# Patient Record
Sex: Male | Born: 1975 | Race: White | Hispanic: No | Marital: Single | Smoking: Former smoker
Health system: Southern US, Community
[De-identification: ages and names within clinical notes are randomized; demographics above are authoritative.]

## PROBLEM LIST (undated history)

## (undated) DIAGNOSIS — G709 Myoneural disorder, unspecified: Secondary | ICD-10-CM

## (undated) DIAGNOSIS — F419 Anxiety disorder, unspecified: Secondary | ICD-10-CM

## (undated) DIAGNOSIS — T7840XA Allergy, unspecified, initial encounter: Secondary | ICD-10-CM

## (undated) HISTORY — DX: Myoneural disorder, unspecified: G70.9

## (undated) HISTORY — DX: Allergy, unspecified, initial encounter: T78.40XA

## (undated) HISTORY — DX: Anxiety disorder, unspecified: F41.9

---

## 2006-08-24 ENCOUNTER — Ambulatory Visit: Payer: Self-pay | Admitting: Family Medicine

## 2012-06-30 ENCOUNTER — Ambulatory Visit: Payer: Self-pay | Admitting: Family Medicine

## 2013-01-03 ENCOUNTER — Ambulatory Visit: Payer: Self-pay | Admitting: Family Medicine

## 2013-03-22 ENCOUNTER — Ambulatory Visit: Payer: Self-pay | Admitting: Urology

## 2013-11-04 IMAGING — CT CT ABDOMEN AND PELVIS WITHOUT AND WITH CONTRAST
2 of 4 series · 14 of 32 positions shown, 19 images · non-contrast
Comparison: none

REASON FOR EXAM: prostatitis, anterior pelvic pain, rectal bleed, IV and
oral contrast WWO
COMMENTS:

PROCEDURE:     CT  - CT ABDOMEN / PELVIS  W/WO  - March 22, 2013  [DATE]
RESULT:     History: Prostatitis. Pelvic pain. Rectal bleed.
Comparison Study: No prior.

[Series 2: without · axial · non-contrast · 0.81mm/px · z∈[-519,-138]mm · 8 of 165 slices shown, 13 images]
[im 19/165  soft-tissue]
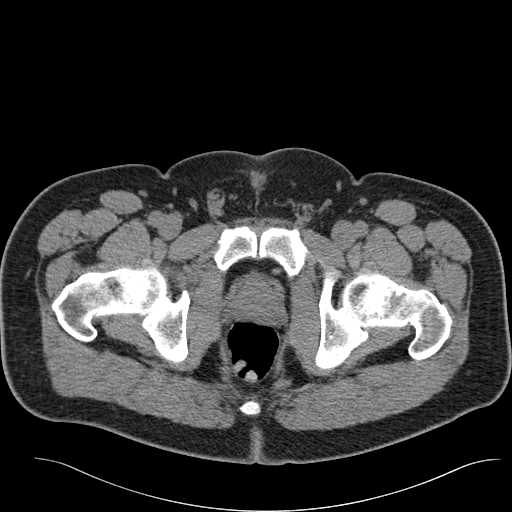
[im 19/165  bone]
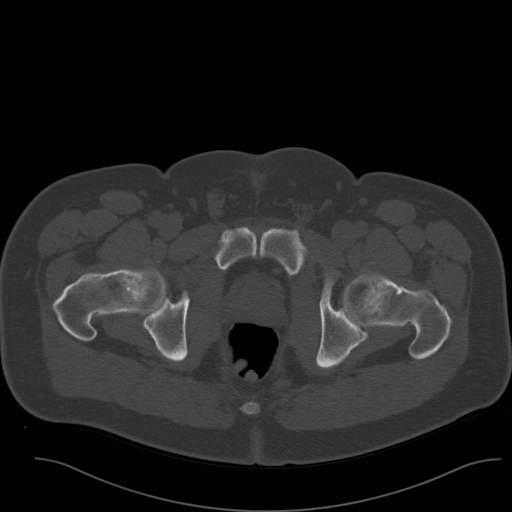
[im 37/165  soft-tissue]
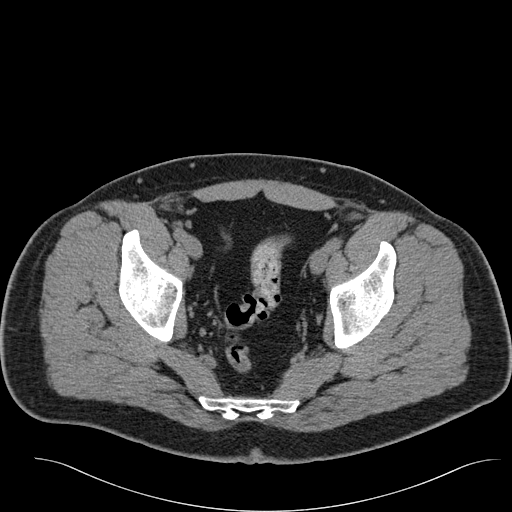
[im 55/165  soft-tissue]
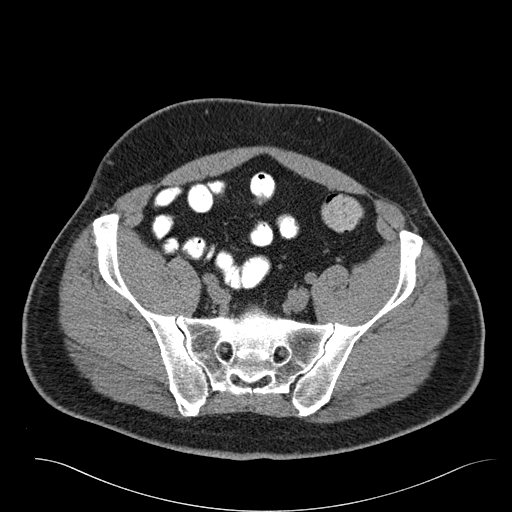
[im 73/165  soft-tissue]
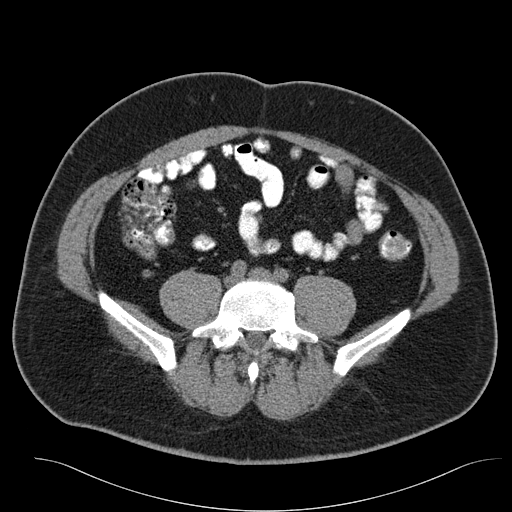
[im 92/165  soft-tissue]
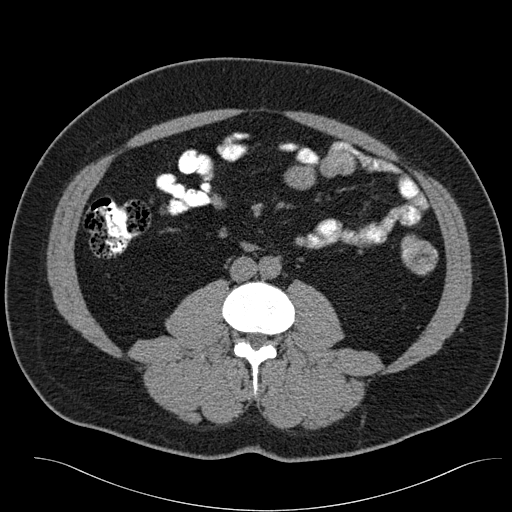
[im 92/165  lung]
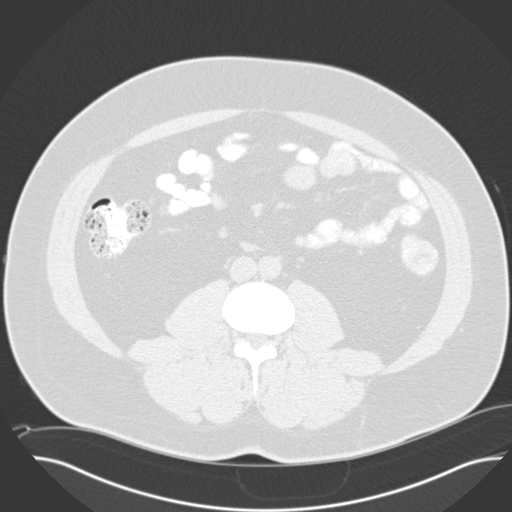
[im 110/165  soft-tissue]
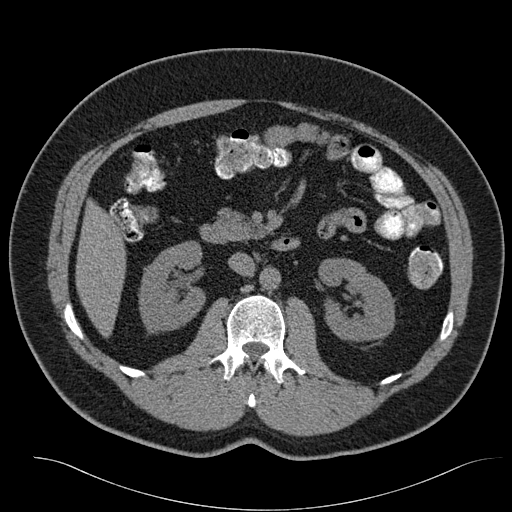
[im 110/165  lung]
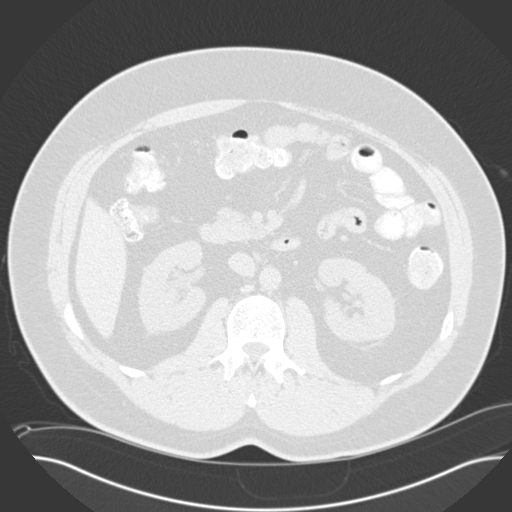
[im 128/165  soft-tissue]
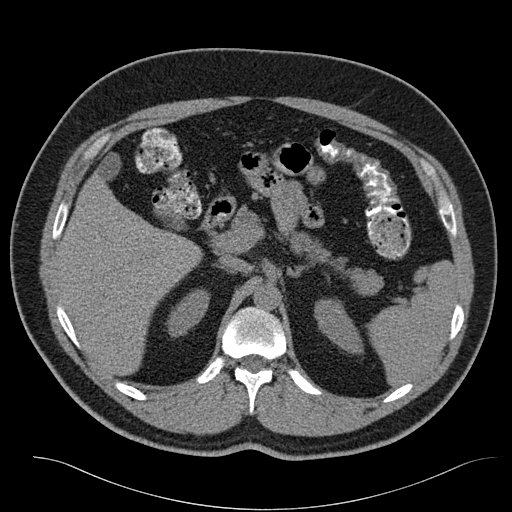
[im 128/165  lung]
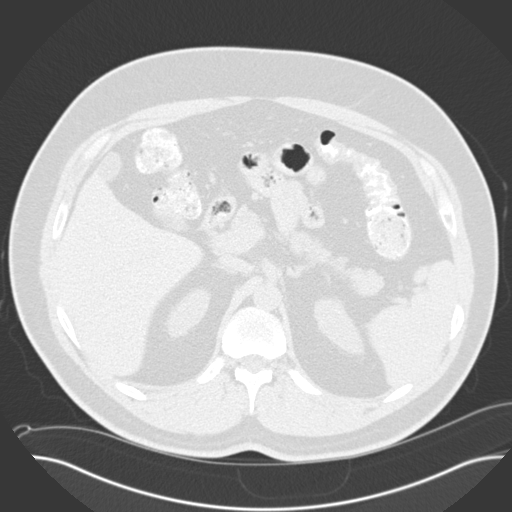
[im 146/165  soft-tissue]
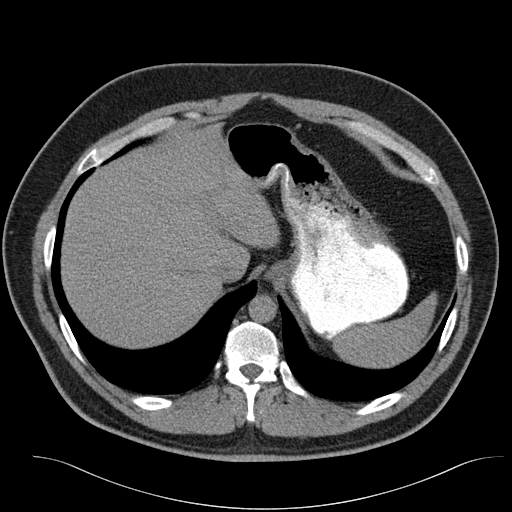
[im 146/165  lung]
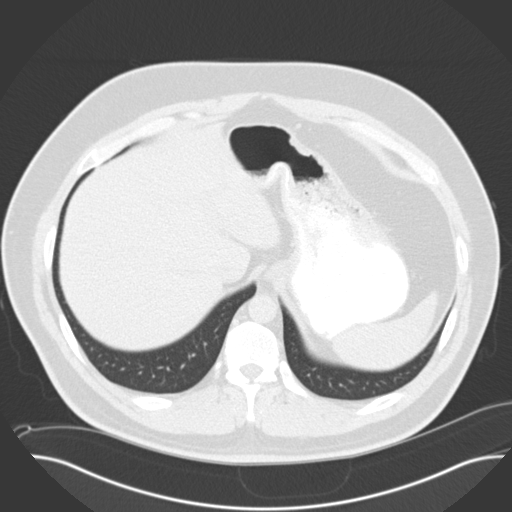

[Series 4: with · axial · 0.81mm/px · z∈[-519,-246]mm · 6 of 165 slices shown]
[im 19/165  soft-tissue]
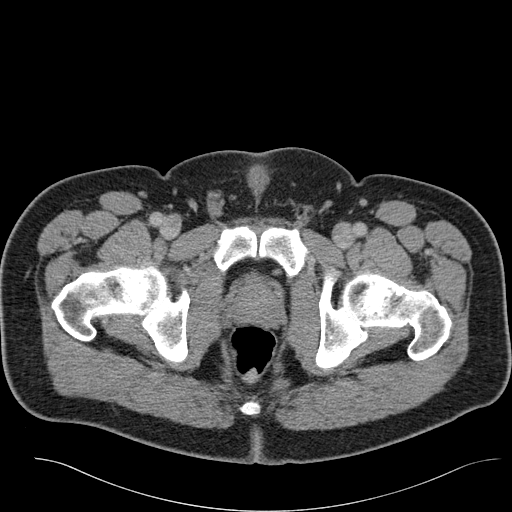
[im 37/165  soft-tissue]
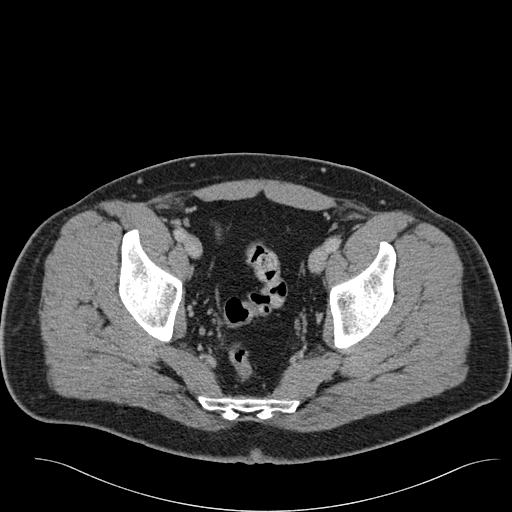
[im 55/165  soft-tissue]
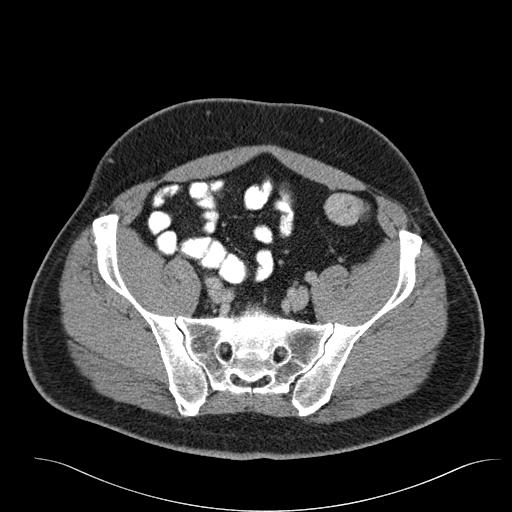
[im 73/165  soft-tissue]
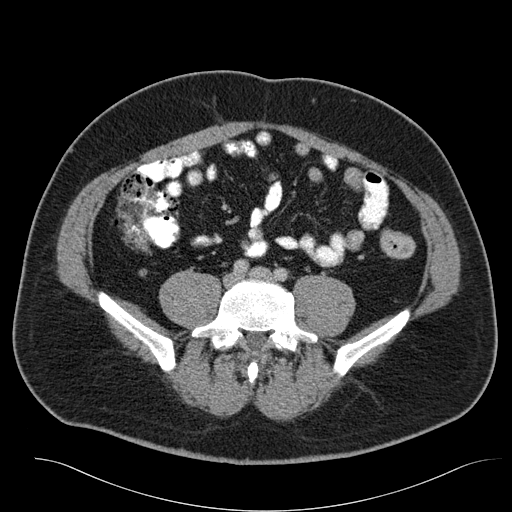
[im 92/165  soft-tissue]
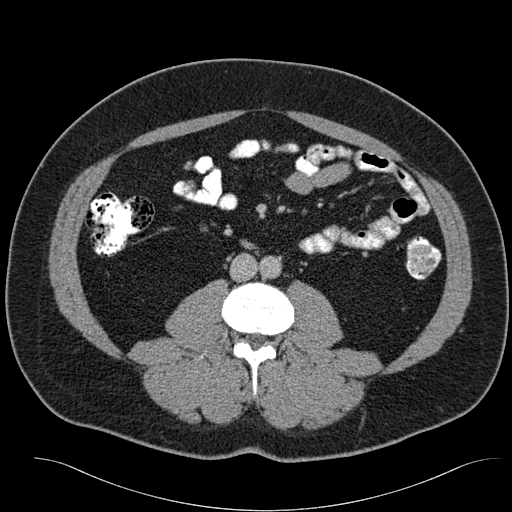
[im 110/165  soft-tissue]
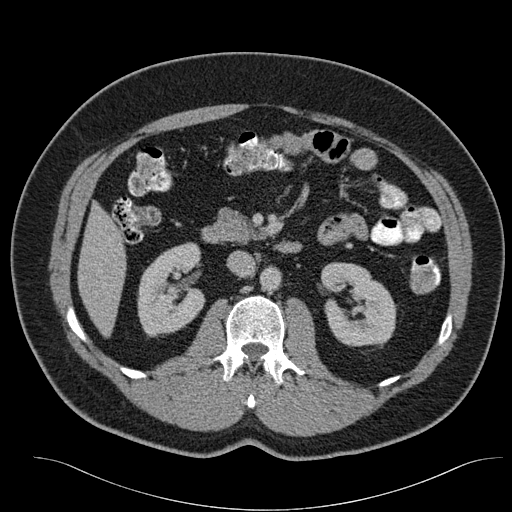

[14 of 32 positions shown; findings below may reference images not displayed]

FINDINGS: Triphasic CT performed with 100 cc of Fsovue-WPC. Evaluation 3
dimensions on separate workstation performed. Nonenhanced exam reveals no
evidence of urolithiasis. Kidneys are normal. Bladder is nondistended and
without focal abnormality. Prostate is irregular and enlarged. Prostatitis
and/or prostate cancer could present in this fashion. Periprosthetic
nodularity and/or nodes cannot be excluded. No retroperitoneal adenopathy
noted. Small shotty inguinal lymph nodes are noted.

The liver normal. Hepatic and portal veins are patent. Gallbladder is
nondistended. No biliary distention; pancreas is normal. Spleen is normal.
Splenic vein is patent. Adrenals are normal. Gastric and perigastric regions
normal. Stool noted throughout colon. No bowel distention. Appendix is
normal. No inflammatory change in right or left lower quadrant. Aorta and
visceral vessels patent. Lung bases are clear. No acute or focal bony
abnormality.
IMPRESSION: Nodular prostate enlargement with periprosthetic
nodularity. Prostatitis and/or prostate malignancy with periprosthetic small
lymph nodes cannot be excluded.

## 2014-12-13 DIAGNOSIS — R369 Urethral discharge, unspecified: Secondary | ICD-10-CM | POA: Insufficient documentation

## 2015-06-19 DIAGNOSIS — R030 Elevated blood-pressure reading, without diagnosis of hypertension: Secondary | ICD-10-CM | POA: Insufficient documentation

## 2019-10-06 ENCOUNTER — Encounter: Payer: Self-pay | Admitting: Emergency Medicine

## 2019-10-06 ENCOUNTER — Other Ambulatory Visit: Payer: Self-pay

## 2019-10-06 ENCOUNTER — Ambulatory Visit
Admission: EM | Admit: 2019-10-06 | Discharge: 2019-10-06 | Disposition: A | Discharge status: Home / Self Care | Payer: Self-pay

## 2019-10-06 DIAGNOSIS — H6123 Impacted cerumen, bilateral: Secondary | ICD-10-CM

## 2019-10-06 NOTE — ED Provider Notes (Signed)
Roderic Palau    CSN: [id number] Arrival date & time: 10/06/19  1157      History   Chief Complaint Chief Complaint  Patient presents with  . Cerumen Impaction    HPI Johnny Shaw is a 43 y.o. male.   Patient presents with bilateral ear "clogged up" x1 day.  He states he was able to remove some earwax from his left ear with his finger and then used an over-the-counter cerumen kit as well yesterday.  He denies decreased hearing, fever, chills, ear pain, sore throat, cough, shortness of breath, or other symptoms.  The history is provided by the patient.    History reviewed. No pertinent past medical history.  There are no problems to display for this patient.   History reviewed. No pertinent surgical history.     Home Medications    Prior to Admission medications   Not on File    Family History History reviewed. No pertinent family history.  Social History Social History   Tobacco Use  . Smoking status: Never Smoker  . Smokeless tobacco: Never Used  Substance Use Topics  . Alcohol use: Not on file  . Drug use: Not on file     Allergies   Penicillins and Ciprofloxacin hcl   Review of Systems Review of Systems  Constitutional: Negative for chills and fever.  HENT: Negative for ear pain, hearing loss and sore throat.   Eyes: Negative for pain and visual disturbance.  Respiratory: Negative for cough and shortness of breath.   Cardiovascular: Negative for chest pain and palpitations.  Gastrointestinal: Negative for abdominal pain and vomiting.  Genitourinary: Negative for dysuria and hematuria.  Musculoskeletal: Negative for arthralgias and back pain.  Skin: Negative for color change and rash.  Neurological: Negative for seizures and syncope.  All other systems reviewed and are negative.    Physical Exam Triage Vital Signs ED Triage Vitals  Enc Vitals Group     BP      Pulse      Resp      Temp      Temp src      SpO2      Weight        Height      Head Circumference      Peak Flow      Pain Score      Pain Loc      Pain Edu?      Excl. in [place]?    No data found.  Updated Vital Signs BP 131/86   Pulse 82   Temp 98.9 F (37.2 C)   Resp 18   Wt 280 lb (127 kg)   SpO2 97%   Visual Acuity Right Eye Distance:   Left Eye Distance:   Bilateral Distance:    Right Eye Near:   Left Eye Near:    Bilateral Near:     Physical Exam Vitals and nursing note reviewed.  Constitutional:      Appearance: He is well-developed.  HENT:     Head: Normocephalic and atraumatic.     Right Ear: Tympanic membrane normal. There is impacted cerumen.     Left Ear: Tympanic membrane normal. There is impacted cerumen.     Nose: Nose normal.     Mouth/Throat:     Mouth: Mucous membranes are moist.     Pharynx: Oropharynx is clear.  Eyes:     Conjunctiva/sclera: Conjunctivae normal.  Cardiovascular:     Rate and Rhythm: Normal  rate and regular rhythm.     Heart sounds: No murmur.  Pulmonary:     Effort: Pulmonary effort is normal. No respiratory distress.     Breath sounds: Normal breath sounds. No wheezing or rhonchi.  Abdominal:     General: Bowel sounds are normal.     Palpations: Abdomen is soft.     Tenderness: There is no abdominal tenderness. There is no guarding or rebound.  Musculoskeletal:     Cervical back: Neck supple.  Skin:    General: Skin is warm and dry.     Findings: No rash.  Neurological:     General: No focal deficit present.     Mental Status: He is alert and oriented to person, place, and time.  Psychiatric:        Mood and Affect: Mood normal.        Behavior: Behavior normal.      UC Treatments / Results  Labs (all labs ordered are listed, but only abnormal results are displayed) Labs Reviewed - No data to display  EKG   Radiology No results found.  Procedures Procedures (including critical care time)  Medications Ordered in UC Medications - No data to display  Initial  Impression / Assessment and Plan / UC Course  I have reviewed the triage vital signs and the nursing notes.  Pertinent labs & imaging results that were available during my care of the patient were reviewed by me and considered in my medical decision making (see chart for details).   Bilateral impacted cerumen.  Removed with irrigation by nurse.  Patient reports complete relief of symptoms after cerumen removed.  Instructed patient to follow-up with his primary care provider if his symptoms return.  Patient agrees to plan of care.     Final Clinical Impressions(s) / UC Diagnoses   Final diagnoses:  Bilateral impacted cerumen     Discharge Instructions     Your earwax was removed today via irrigation.    Follow up with your primary care provider if your symptoms are not improving.        ED Prescriptions    None     PDMP not reviewed this encounter.   Sharion Balloon, NP 10/06/19 [phone]

## 2019-10-06 NOTE — Discharge Instructions (Addendum)
Your earwax was removed today via irrigation.    Follow up with your primary care provider if your symptoms are not improving.

## 2019-10-06 NOTE — ED Triage Notes (Signed)
Patient in office today c/o bil ear clog since yesterday. Brought OTC drops to remove wax got some out but still feels clog left ear  more than right.

## 2023-02-06 ENCOUNTER — Ambulatory Visit
Admission: RE | Admit: 2023-02-06 | Discharge: 2023-02-06 | Disposition: A | Discharge status: Home / Self Care | Payer: Managed Care, Other (non HMO) | Source: Ambulatory Visit | Attending: Urgent Care | Admitting: Urgent Care

## 2023-02-06 VITALS — BP 147/85 | HR 72 | Temp 97.8°F | Resp 16

## 2023-02-06 DIAGNOSIS — J019 Acute sinusitis, unspecified: Secondary | ICD-10-CM | POA: Diagnosis not present

## 2023-02-06 MED ORDER — PREDNISONE 20 MG PO TABS: ORAL_TABLET | ORAL | 0 refills | Status: AC

## 2023-02-06 NOTE — ED Triage Notes (Signed)
Patient presents to UC for cough since Sunday, sore throat and ear fullness since Monday. Treating with claritin and ibuprofen.

## 2023-02-06 NOTE — Discharge Instructions (Addendum)
Follow up here or with your primary care provider if your symptoms are worsening or not improving.     

## 2023-02-06 NOTE — ED Provider Notes (Signed)
Johnny Shaw    CSN: [id number] Arrival date & time: 02/06/23  1254      History   Chief Complaint Chief Complaint  Patient presents with   Sore Throat    Sore throat since Monday, ear clogged from flying, cough with thick phlegm . Cough started first on Sunday after fishing in high winds in [place], cough was dry sporadic but deep (shook lungs feeling) - Entered by patient   Ear Fullness   Cough    HPI Johnny Shaw is a 47 y.o. male.    Sore Throat  Ear Fullness  Cough   Presents to urgent care with cough productive of thick sputum x 5 days.  Reports sore throat "ear fullness" x 4 days.  He reports his ears clogged from "flying".  History reviewed. No pertinent past medical history.  Patient Active Problem List   Diagnosis Date Noted   Elevated BP without diagnosis of hypertension 06/19/2015   Urethral discharge 12/13/2014    History reviewed. No pertinent surgical history.     Home Medications    Prior to Admission medications   Not on File    Family History History reviewed. No pertinent family history.  Social History Social History   Tobacco Use   Smoking status: Never   Smokeless tobacco: Never  Vaping Use   Vaping Use: Never used  Substance Use Topics   Alcohol use: Yes    Comment: social drinker   Drug use: Never     Allergies   Penicillins and Ciprofloxacin hcl   Review of Systems Review of Systems  Respiratory:  Positive for cough.      Physical Exam Triage Vital Signs ED Triage Vitals  Enc Vitals Group     BP 02/06/23 1313 (!) 147/85     Pulse Rate 02/06/23 1313 72     Resp 02/06/23 1313 16     Temp 02/06/23 1313 97.8 F (36.6 C)     Temp Source 02/06/23 1313 Temporal     SpO2 02/06/23 1313 97 %     Weight --      Height --      Head Circumference --      Peak Flow --      Pain Score 02/06/23 1312 0     Pain Loc --      Pain Edu? --      Excl. in [place]? --    No data found.  Updated Vital  Signs BP (!) 147/85 (BP Location: Left Arm)   Pulse 72   Temp 97.8 F (36.6 C) (Temporal)   Resp 16   SpO2 97%   Visual Acuity Right Eye Distance:   Left Eye Distance:   Bilateral Distance:    Right Eye Near:   Left Eye Near:    Bilateral Near:     Physical Exam Vitals reviewed.  Constitutional:      Appearance: He is well-developed.  HENT:     Right Ear: Tympanic membrane is erythematous.     Left Ear: Tympanic membrane normal.     Ears:      Mouth/Throat:     Mouth: Mucous membranes are moist.     Pharynx: Posterior oropharyngeal erythema present. No oropharyngeal exudate.  Cardiovascular:     Rate and Rhythm: Normal rate and regular rhythm.  Pulmonary:     Effort: Pulmonary effort is normal.     Breath sounds: Normal breath sounds.  Skin:    General: Skin is warm  and dry.  Neurological:     General: No focal deficit present.     Mental Status: He is alert and oriented to person, place, and time.  Psychiatric:        Mood and Affect: Mood normal.        Behavior: Behavior normal.      UC Treatments / Results  Labs (all labs ordered are listed, but only abnormal results are displayed) Labs Reviewed - No data to display  EKG   Radiology No results found.  Procedures Procedures (including critical care time)  Medications Ordered in UC Medications - No data to display  Initial Impression / Assessment and Plan / UC Course  I have reviewed the triage vital signs and the nursing notes.  Pertinent labs & imaging results that were available during my care of the patient were reviewed by me and considered in my medical decision making (see chart for details).   Johnny Shaw is a 47 y.o. male presenting with URI symptoms. Patient is afebrile without recent antipyretics, satting well on room air. Overall is well appearing though non-toxic, well hydrated, without respiratory distress. Pulmonary exam is unremarkable.  Lungs CTAB without wheezing, rhonchi,  rales.  A rim of redness is present around the right TM, there is pharyngeal erythema.  No peritonsillar exudates.  Patient's symptoms are consistent with an acute viral process.  We discussed antibiotic stewardship, recommended use of prednisone to reduce the symptoms of sinusitis resulting in eustachian tube dysfunction.  This is likely to help with his sore throat and cough as well.  Otherwise recommending supportive care and use of OTC medication for symptom control as needed.  Counseled patient on potential for adverse effects with medications prescribed/recommended today, ER and return-to-clinic precautions discussed, patient verbalized understanding and agreement with care plan.   Final Clinical Impressions(s) / UC Diagnoses   Final diagnoses:  None   Discharge Instructions   None    ED Prescriptions   None    PDMP not reviewed this encounter.   Charma Igo, [place] 02/06/23 1328

## 2023-02-09 ENCOUNTER — Ambulatory Visit
Admission: EM | Admit: 2023-02-09 | Discharge: 2023-02-09 | Disposition: A | Discharge status: Home / Self Care | Payer: Managed Care, Other (non HMO) | Attending: Emergency Medicine | Admitting: Emergency Medicine

## 2023-02-09 DIAGNOSIS — H1033 Unspecified acute conjunctivitis, bilateral: Secondary | ICD-10-CM

## 2023-02-09 DIAGNOSIS — H6691 Otitis media, unspecified, right ear: Secondary | ICD-10-CM

## 2023-02-09 DIAGNOSIS — J0100 Acute maxillary sinusitis, unspecified: Secondary | ICD-10-CM

## 2023-02-09 MED ORDER — POLYMYXIN B-TRIMETHOPRIM 10000-0.1 UNIT/ML-% OP SOLN: 1.0000 [drp] | Freq: Four times a day (QID) | OPHTHALMIC | 0 refills | Status: AC

## 2023-02-09 MED ORDER — AZITHROMYCIN 250 MG PO TABS: 250.0000 mg | ORAL_TABLET | Freq: Every day | ORAL | 0 refills | Status: DC

## 2023-02-09 NOTE — ED Triage Notes (Signed)
Patient to Urgent Care with complaints of right sided eye redness and drainage. Woke up this morning with eye crusted shut. Some redness present to left eye. Also complains of right ear pain and fullness.  Symptoms started 8 days ago. Initially started with sore throat/ cough/ sinus pain after fishing in [place]. Treated Friday with prednisone taper (two days remaining). Denies sore throat at this time.   Taking claritin-D/ mucinex/ tylenol cold and flu.

## 2023-02-09 NOTE — Discharge Instructions (Addendum)
Take the Zithromax and use the antibiotic eye drops as directed.  Follow up with your primary care provider if your symptoms are not improving.    

## 2023-02-09 NOTE — ED Provider Notes (Signed)
Johnny Shaw    CSN: [id number] Arrival date & time: 02/09/23  0816      History   Chief Complaint Chief Complaint  Patient presents with   Eye Problem   Otalgia    HPI Johnny Shaw is a 47 y.o. male.  Patient present with 8 day history of congestion, postnasal drip, ear pain, cough.  He also reports right eye redness, itching, yellow-green drainage, crusting in lashes x 2 days.  No eye pain, change in vision, fever, rash, shortness of breath, or other symptoms.  Patient was seen at this urgent care on 02/06/2023; diagnosed with sinusitis and treated with prednisone.   The history is provided by the patient and medical records.    History reviewed. No pertinent past medical history.  Patient Active Problem List   Diagnosis Date Noted   Elevated BP without diagnosis of hypertension 06/19/2015   Urethral discharge 12/13/2014    History reviewed. No pertinent surgical history.     Home Medications    Prior to Admission medications   Medication Sig Start Date End Date Taking? Authorizing Provider  azithromycin (ZITHROMAX) 250 MG tablet Take 1 tablet (250 mg total) by mouth daily. Take first 2 tablets together, then 1 every day until finished. 02/09/23  Yes Mickie Bail, NP  trimethoprim-polymyxin b (POLYTRIM) ophthalmic solution Place 1 drop into both eyes 4 (four) times daily for 7 days. 02/09/23 02/16/23 Yes Mickie Bail, NP  predniSONE (DELTASONE) 20 MG tablet Take 3 tablets (60 mg total) by mouth daily with breakfast for 2 days, THEN 2 tablets (40 mg total) daily with breakfast for 2 days, THEN 1 tablet (20 mg total) daily with breakfast for 2 days. 02/06/23 02/11/23  Immordino, Jeannett Senior, FNP    Family History History reviewed. No pertinent family history.  Social History Social History   Tobacco Use   Smoking status: Never   Smokeless tobacco: Never  Vaping Use   Vaping Use: Never used  Substance Use Topics   Alcohol use: Yes    Comment: social drinker    Drug use: Never     Allergies   Penicillins and Ciprofloxacin hcl   Review of Systems Review of Systems  Constitutional:  Negative for chills and fever.  HENT:  Positive for congestion, ear pain, postnasal drip, rhinorrhea and sinus pressure. Negative for sore throat.   Eyes:  Positive for discharge, redness and itching. Negative for pain and visual disturbance.  Respiratory:  Positive for cough. Negative for shortness of breath.   Cardiovascular:  Negative for chest pain and palpitations.  Gastrointestinal:  Negative for abdominal pain, diarrhea and vomiting.  Skin:  Negative for rash.  All other systems reviewed and are negative.    Physical Exam Triage Vital Signs ED Triage Vitals  Enc Vitals Group     BP      Pulse      Resp      Temp      Temp src      SpO2      Weight      Height      Head Circumference      Peak Flow      Pain Score      Pain Loc      Pain Edu?      Excl. in [place]?    No data found.  Updated Vital Signs BP 134/84   Pulse 90   Temp 98.4 F (36.9 C)   Resp 18  SpO2 98%   Visual Acuity Right Eye Distance:   Left Eye Distance:   Bilateral Distance:    Right Eye Near:   Left Eye Near:    Bilateral Near:     Physical Exam Vitals and nursing note reviewed.  Constitutional:      General: He is not in acute distress.    Appearance: Normal appearance. He is well-developed. He is not ill-appearing.  HENT:     Right Ear: Ear canal normal. Tympanic membrane is erythematous.     Left Ear: Tympanic membrane and ear canal normal.     Nose: Congestion and rhinorrhea present.     Mouth/Throat:     Mouth: Mucous membranes are moist.     Pharynx: Oropharynx is clear.  Eyes:     General: Lids are normal. Vision grossly intact.        Right eye: No discharge.        Left eye: No discharge.     Extraocular Movements: Extraocular movements intact.     Conjunctiva/sclera:     Right eye: Right conjunctiva is injected.     Left eye: Left  conjunctiva is injected.     Pupils: Pupils are equal, round, and reactive to light.  Cardiovascular:     Rate and Rhythm: Normal rate and regular rhythm.     Heart sounds: Normal heart sounds.  Pulmonary:     Effort: Pulmonary effort is normal. No respiratory distress.     Breath sounds: Normal breath sounds.  Musculoskeletal:     Cervical back: Neck supple.  Skin:    General: Skin is warm and dry.  Neurological:     Mental Status: He is alert.  Psychiatric:        Mood and Affect: Mood normal.        Behavior: Behavior normal.      UC Treatments / Results  Labs (all labs ordered are listed, but only abnormal results are displayed) Labs Reviewed - No data to display  EKG   Radiology No results found.  Procedures Procedures (including critical care time)  Medications Ordered in UC Medications - No data to display  Initial Impression / Assessment and Plan / UC Course  I have reviewed the triage vital signs and the nursing notes.  Pertinent labs & imaging results that were available during my care of the patient were reviewed by me and considered in my medical decision making (see chart for details).   Right otitis media, acute sinusitis, bilateral bacterial conjunctivitis.  Afebrile and vital signs are stable.  Patient has been symptomatic for more than a week.  He was treated with prednisone on 02/06/2023 but his symptoms are worsening.  Treating today with Zithromax and Polytrim eyedrops.  Instructed him to finish the prednisone as prescribed.  Tylenol or ibuprofen as needed.  Education provided on otitis media and conjunctivitis.  Patient does not currently have a PCP but our nurse scheduled him with an appointment to establish.  He agrees to plan of care.   Final Clinical Impressions(s) / UC Diagnoses   Final diagnoses:  Right otitis media, unspecified otitis media type  Acute non-recurrent maxillary sinusitis  Acute bacterial conjunctivitis of both eyes      Discharge Instructions      Take the Zithromax and use the antibiotic eye drops as directed.  Follow up with your primary care provider if your symptoms are not improving.        ED Prescriptions  Medication Sig Dispense Auth. Provider   azithromycin (ZITHROMAX) 250 MG tablet Take 1 tablet (250 mg total) by mouth daily. Take first 2 tablets together, then 1 every day until finished. 6 tablet Mickie Bail, NP   trimethoprim-polymyxin b (POLYTRIM) ophthalmic solution Place 1 drop into both eyes 4 (four) times daily for 7 days. 10 mL Mickie Bail, NP      PDMP not reviewed this encounter.   Mickie Bail, NP 02/09/23 [phone]

## 2023-02-09 NOTE — ED Notes (Signed)
Patient established with PCP. Appointment made for June 10th at 11am @ [facility] at [employer].

## 2023-03-23 ENCOUNTER — Encounter: Payer: Self-pay | Admitting: Nurse Practitioner

## 2023-03-23 ENCOUNTER — Ambulatory Visit: Payer: Managed Care, Other (non HMO) | Admitting: Nurse Practitioner

## 2023-03-23 VITALS — BP 120/84 | HR 62 | Temp 98.0°F | Ht 72.44 in | Wt 248.0 lb

## 2023-03-23 DIAGNOSIS — R051 Acute cough: Secondary | ICD-10-CM | POA: Diagnosis not present

## 2023-03-23 DIAGNOSIS — F419 Anxiety disorder, unspecified: Secondary | ICD-10-CM | POA: Insufficient documentation

## 2023-03-23 DIAGNOSIS — J309 Allergic rhinitis, unspecified: Secondary | ICD-10-CM | POA: Insufficient documentation

## 2023-03-23 MED ORDER — AZELASTINE HCL 0.1 % NA SOLN
1.0000 | Freq: Two times a day (BID) | NASAL | 12 refills | Status: AC
Start: 2023-03-23 — End: ?

## 2023-03-23 MED ORDER — BENZONATATE 200 MG PO CAPS: 200.0000 mg | ORAL_CAPSULE | Freq: Three times a day (TID) | ORAL | 0 refills | Status: AC | PRN

## 2023-03-23 MED ORDER — CLARITIN-D 12 HOUR 5-120 MG PO TB12: 1.0000 | ORAL_TABLET | Freq: Two times a day (BID) | ORAL | 5 refills | Status: AC

## 2023-03-23 NOTE — Progress Notes (Signed)
Bethanie Dicker, NP-C Phone: [phone]  Johnny Shaw is a 47 y.o. male who presents today to establish care and for allergies.   Patient has been traveling recently back and forth to [place] for work. He has been having problems with his allergies and congestion due to this. He believes there is something in [place] that he is allergic to. He was seen by Urgent Care twice in April. He has been treated with Azithromycin and Doxycycline for sinus infections. He is currently on Prednisone that was prescribed to him via a virtual visit. He takes Claritin-D 12 hr daily. His symptoms include a mild cough and post nasal drainage. Denies congestion. Denies sore throat. His drainage is worse in the mornings. Denies fevers. Denies shortness of breath. He will have to travel once more to [place] at the end of the month.   Active Ambulatory Problems    Diagnosis Date Noted   Urethral discharge 12/13/2014   Elevated BP without diagnosis of hypertension 06/19/2015   Anxiety 03/23/2023   Allergic rhinitis 03/23/2023   Acute cough 03/26/2023   Resolved Ambulatory Problems    Diagnosis Date Noted   No Resolved Ambulatory Problems   Past Medical History:  Diagnosis Date   Allergy Always   Neuromuscular disorder (HCC) Years ago    Family History  Problem Relation Age of Onset   Anxiety disorder Sister    Depression Sister    Obesity Brother     Social History   Socioeconomic History   Marital status: Single    Spouse name: Not on file   Number of children: Not on file   Years of education: Not on file   Highest education level: Not on file  Occupational History   Not on file  Tobacco Use   Smoking status: Former    Packs/day: 0.50    Years: 2.00    Additional pack years: 0.00    Total pack years: 1.00    Types: Cigarettes    Quit date: 05/14/2003    Years since quitting: 19.8   Smokeless tobacco: Never  Vaping Use   Vaping Use: Never used  Substance and Sexual Activity   Alcohol use:  Yes    Alcohol/week: 1.0 standard drink of alcohol    Types: 1 Cans of beer per week    Comment: social drinker   Drug use: Never   Sexual activity: Not Currently    Birth control/protection: Condom  Other Topics Concern   Not on file  Social History Narrative   Not on file   Social Determinants of Health   Financial Resource Strain: Not on file  Food Insecurity: Not on file  Transportation Needs: Not on file  Physical Activity: Not on file  Stress: Not on file  Social Connections: Not on file  Intimate Partner Violence: Not on file    ROS  General:  Negative for unexplained weight loss, fever Skin: Negative for new or changing mole, sore that won't heal HEENT: Negative for trouble hearing, trouble seeing, ringing in ears, mouth sores, hoarseness, change in voice, dysphagia. CV:  Negative for chest pain, dyspnea, edema, palpitations Resp: Negative for dyspnea, hemoptysis GI: Negative for nausea, vomiting, diarrhea, constipation, abdominal pain, melena, hematochezia. GU: Negative for dysuria, incontinence, urinary hesitance, hematuria, vaginal or penile discharge, polyuria, sexual difficulty, lumps in testicle or breasts MSK: Negative for muscle cramps or aches, joint pain or swelling Neuro: Negative for headaches, weakness, numbness, dizziness, passing out/fainting Psych: Negative for depression, anxiety, memory problems  Objective  Physical Exam Vitals:   03/23/23 1111  BP: 120/84  Pulse: 62  Temp: 98 F (36.7 C)  SpO2: 99%    BP Readings from Last 3 Encounters:  03/23/23 120/84  02/09/23 134/84  02/06/23 (!) 147/85   Wt Readings from Last 3 Encounters:  03/23/23 248 lb (112.5 kg)  10/06/19 280 lb (127 kg)    Physical Exam Constitutional:      General: He is not in acute distress.    Appearance: Normal appearance.  HENT:     Head: Normocephalic.     Right Ear: Tympanic membrane normal.     Left Ear: Tympanic membrane normal.     Nose: Nose normal.      Mouth/Throat:     Mouth: Mucous membranes are moist.     Pharynx: Oropharynx is clear.  Eyes:     Conjunctiva/sclera: Conjunctivae normal.     Pupils: Pupils are equal, round, and reactive to light.  Cardiovascular:     Rate and Rhythm: Normal rate and regular rhythm.     Heart sounds: Normal heart sounds.  Pulmonary:     Effort: Pulmonary effort is normal.     Breath sounds: Normal breath sounds.  Lymphadenopathy:     Cervical: No cervical adenopathy.  Skin:    General: Skin is warm and dry.  Neurological:     General: No focal deficit present.     Mental Status: He is alert.  Psychiatric:        Mood and Affect: Mood normal.        Behavior: Behavior normal.    Assessment/Plan:   Acute cough Assessment & Plan: Will treat with Benzonatate TID PRN. Advised to finish Prednisone Rx as directed. I do not feel that additional antibiotics are warranted at this time. Encouraged adequate fluid intake. Return to care if symptoms are changing or worsening.   Orders: -     Benzonatate; Take 1 capsule (200 mg total) by mouth 3 (three) times daily as needed for cough.  Dispense: 30 capsule; Refill: 0  Allergic rhinitis, unspecified seasonality, unspecified trigger Assessment & Plan: Will send Rx for Claritin-D for patient to continue daily. Add Azelastine nasal spray BID. He will complete his Prednisone Rx as prescribed.  Orders: -     Claritin-D 12 Hour; Take 1 tablet by mouth 2 (two) times daily.  Dispense: 60 tablet; Refill: 5 -     Azelastine HCl; Place 1-2 sprays into both nostrils 2 (two) times daily.  Dispense: 30 mL; Refill: 12   Return for Annual Exam.   Bethanie Dicker, NP-C [facility] [employer]

## 2023-03-26 ENCOUNTER — Encounter: Payer: Self-pay | Admitting: Nurse Practitioner

## 2023-03-26 DIAGNOSIS — R051 Acute cough: Secondary | ICD-10-CM | POA: Insufficient documentation

## 2023-03-26 NOTE — Assessment & Plan Note (Signed)
Will send Rx for Claritin-D for patient to continue daily. Add Azelastine nasal spray BID. He will complete his Prednisone Rx as prescribed.

## 2023-03-26 NOTE — Assessment & Plan Note (Addendum)
Will treat with Benzonatate TID PRN. Advised to finish Prednisone Rx as directed. I do not feel that additional antibiotics are warranted at this time. Encouraged adequate fluid intake. Return to care if symptoms are changing or worsening.

## 2023-06-04 ENCOUNTER — Other Ambulatory Visit: Payer: Managed Care, Other (non HMO)

## 2023-06-04 ENCOUNTER — Ambulatory Visit
Admission: EM | Admit: 2023-06-04 | Discharge: 2023-06-04 | Disposition: A | Discharge status: Home / Self Care | Payer: Managed Care, Other (non HMO) | Attending: Emergency Medicine | Admitting: Emergency Medicine

## 2023-06-04 ENCOUNTER — Ambulatory Visit (INDEPENDENT_AMBULATORY_CARE_PROVIDER_SITE_OTHER): Payer: Managed Care, Other (non HMO)

## 2023-06-04 DIAGNOSIS — M25571 Pain in right ankle and joints of right foot: Secondary | ICD-10-CM

## 2023-06-04 DIAGNOSIS — S92514A Nondisplaced fracture of proximal phalanx of right lesser toe(s), initial encounter for closed fracture: Secondary | ICD-10-CM | POA: Diagnosis not present

## 2023-06-04 DIAGNOSIS — M25579 Pain in unspecified ankle and joints of unspecified foot: Secondary | ICD-10-CM | POA: Insufficient documentation

## 2023-06-04 DIAGNOSIS — S93334A Other dislocation of right foot, initial encounter: Secondary | ICD-10-CM

## 2023-06-04 NOTE — ED Triage Notes (Signed)
Patient to Urgent Care with complaints of right sided foot pain that started today after an injury.  Reports that he was playing golf when his friend hit a golf ball in close proximity to him. Patient reports the golf ball hit the lateral side of his right foot. Pain worst in fourth and fifth digit.

## 2023-06-04 NOTE — Discharge Instructions (Signed)
X-ray shows injury to the right fifth toe and injury to the accompanying joint  Fourth and fifth toe has been buddy taped and you have been placed in a boot to add stability and support when completing activity, wear whenever completing lobate, may remove at rest  May use ice and heat over the affected area 10 to 15-minute intervals  Elevate whenever sitting and lying to help reduce swelling  May take Tylenol and or Motrin as needed for pain  Please follow-up with orthopedics or podiatry for further evaluation, will discuss with your timeline of healing and anything that will need to be done to promote healing, information on front page

## 2023-06-04 NOTE — ED Provider Notes (Signed)
Johnny Shaw    CSN: [id number] Arrival date & time: 06/04/23  1809      History   Chief Complaint Chief Complaint  Patient presents with   Foot Pain    HPI Johnny Shaw is a 47 y.o. male.   Patient presents for evaluation of right fifth toe pain and swelling beginning today after injury.  Foot was hit with a golf ball going over 100 mph, ricocheted off of the foot.  Able to complete game after injury. associated bruising.  Able to bear weight and complete range of motion but some pain elicited intermittently.  Has attempted use of Tylenol.  Past Medical History:  Diagnosis Date   Allergy Always   Enviromntal   Anxiety 2014   Neuromuscular disorder (HCC) Years ago   Surface numbness on thigh    Patient Active Problem List   Diagnosis Date Noted   Pain in joint, ankle and foot 06/04/2023   Acute cough 03/26/2023   Anxiety 03/23/2023   Allergic rhinitis 03/23/2023   Elevated BP without diagnosis of hypertension 06/19/2015   Urethral discharge 12/13/2014    History reviewed. No pertinent surgical history.     Home Medications    Prior to Admission medications   Medication Sig Start Date End Date Taking? Authorizing Provider  azelastine (ASTELIN) 0.1 % nasal spray Place 1-2 sprays into both nostrils 2 (two) times daily. 03/23/23   Bethanie Dicker, NP  benzonatate (TESSALON) 200 MG capsule Take 1 capsule (200 mg total) by mouth 3 (three) times daily as needed for cough. 03/23/23   Bethanie Dicker, NP  loratadine-pseudoephedrine (CLARITIN-D 12 HOUR) 5-120 MG tablet Take 1 tablet by mouth 2 (two) times daily. 03/23/23   Bethanie Dicker, NP  predniSONE (DELTASONE) 10 MG tablet Take by mouth as directed. 03/14/23   [provider]    Family History Family History  Problem Relation Age of Onset   Anxiety disorder Sister    Depression Sister    Obesity Brother     Social History Social History   Tobacco Use   Smoking status: Former    Current packs/day:  0.00    Average packs/day: 0.5 packs/day for 2.0 years (1.0 ttl pk-yrs)    Types: Cigarettes    Start date: 05/13/2001    Quit date: 05/14/2003    Years since quitting: 20.0   Smokeless tobacco: Never  Vaping Use   Vaping status: Never Used  Substance Use Topics   Alcohol use: Yes    Alcohol/week: 1.0 standard drink of alcohol    Types: 1 Cans of beer per week    Comment: social drinker   Drug use: Never     Allergies   Penicillins and Ciprofloxacin hcl   Review of Systems Review of Systems   Physical Exam Triage Vital Signs ED Triage Vitals  Encounter Vitals Group     BP 06/04/23 1914 (!) 143/92     Systolic BP Percentile --      Diastolic BP Percentile --      Pulse Rate 06/04/23 1914 68     Resp 06/04/23 1914 18     Temp 06/04/23 1914 98.3 F (36.8 C)     Temp Source 06/04/23 1914 Oral     SpO2 06/04/23 1914 98 %     Weight --      Height --      Head Circumference --      Peak Flow --      Pain Score 06/04/23 1909  7     Pain Loc --      Pain Education --      Exclude from Growth Chart --    No data found.  Updated Vital Signs BP (!) 143/92 (BP Location: Left Arm)   Pulse 68   Temp 98.3 F (36.8 C) (Oral)   Resp 18   SpO2 98%   Visual Acuity Right Eye Distance:   Left Eye Distance:   Bilateral Distance:    Right Eye Near:   Left Eye Near:    Bilateral Near:     Physical Exam Constitutional:      Appearance: Normal appearance.  Eyes:     Extraocular Movements: Extraocular movements intact.  Pulmonary:     Effort: Pulmonary effort is normal.  Feet:     Comments: Ecchymosis, mild to moderate swelling present to the fifth toe of the right foot, tenderness along the proximal phalanx on the dorsum aspect and the lateral aspect, able to bear weight and able to complete range of motion, sensation intact, due to discoloration unable to assess capillary refill Neurological:     Mental Status: He is alert and oriented to person, place, and time. Mental  status is at baseline.      UC Treatments / Results  Labs (all labs ordered are listed, but only abnormal results are displayed) Labs Reviewed - No data to display  EKG   Radiology DG Foot Complete Right  Result Date: 06/04/2023 CLINICAL DATA:  Foot pain after injury. Golf ball hit foot today. Pain, swelling and bruising. EXAM: RIGHT FOOT COMPLETE - 3+ VIEW COMPARISON:  None Available. FINDINGS: Comminuted and displaced fifth toe proximal phalanx fracture. Fracture extends into the metatarsal phalangeal joint. No additional fracture. Normal alignment. No significant arthropathy. Diminutive plantar calcaneal spur. IMPRESSION: Comminuted and displaced fifth toe proximal phalanx fracture with metatarsal-phalangeal joint intra-articular extension. Electronically Signed   By: Narda Rutherford M.D.   On: 06/04/2023 19:13    Procedures Procedures (including critical care time)  Medications Ordered in UC Medications - No data to display  Initial Impression / Assessment and Plan / UC Course  I have reviewed the triage vital signs and the nursing notes.  Pertinent labs & imaging results that were available during my care of the patient were reviewed by me and considered in my medical decision making (see chart for details).  Closed nondisplaced fracture of the proximal phalanx of the lesser toe right foot, initial encounter, closed dislocation of the metatarsal joint of right foot, initial encounter, pain in joint involving right ankle and foot  Injury confirmed on x-ray, discussed with patient, buddy tape fourth and the fifth toe and then placed in cam boot, able to manage pain with over-the-counter analgesics, recommended continued use as needed, can be to be used whenever completing activity may remove at rest, recommend ice and heat to reduce swelling and for management of ecchymosis, may also use elevation to reduce swelling, given referral to podiatry/Ortho for follow-up in 1 to 2  weeks Final Clinical Impressions(s) / UC Diagnoses   Final diagnoses:  Pain in joint involving right ankle and foot  Closed nondisplaced fracture of proximal phalanx of lesser toe of right foot, initial encounter  Closed dislocation of metatarsal joint of right foot, initial encounter     Discharge Instructions      X-ray shows injury to the right fifth toe and injury to the accompanying joint  Fourth and fifth toe has been buddy taped and you have  been placed in a boot to add stability and support when completing activity, wear whenever completing lobate, may remove at rest  May use ice and heat over the affected area 10 to 15-minute intervals  Elevate whenever sitting and lying to help reduce swelling  May take Tylenol and or Motrin as needed for pain  Please follow-up with orthopedics or podiatry for further evaluation, will discuss with your timeline of healing and anything that will need to be done to promote healing, information on front page   ED Prescriptions   None    PDMP not reviewed this encounter.   Valinda Hoar, [place] 06/08/23 [phone]

## 2023-06-09 ENCOUNTER — Ambulatory Visit: Payer: Managed Care, Other (non HMO) | Admitting: Podiatry

## 2023-06-09 DIAGNOSIS — S92501A Displaced unspecified fracture of right lesser toe(s), initial encounter for closed fracture: Secondary | ICD-10-CM

## 2023-06-09 NOTE — Progress Notes (Signed)
  Subjective:  Patient ID: Johnny Shaw, male    DOB: Aug 25, 1976,  MRN: [id number]  Chief Complaint  Patient presents with   Toe Pain    47 y.o. male presents with the above complaint.  Patient presents with right fifth digit fracture.  Patient was seen at the urgent care on 06/04/2023.  He went to get it evaluated.  He wants to make sure that he does not need surgery.  He has been walking with a surgical shoe that he got from surgery center.  Denies any other acute complaints pain is slowly getting better.   Review of Systems: Negative except as noted in the HPI. Denies N/V/F/Ch.  Past Medical History:  Diagnosis Date   Allergy Always   Enviromntal   Anxiety 2014   Neuromuscular disorder (HCC) Years ago   Surface numbness on thigh    Current Outpatient Medications:    azelastine (ASTELIN) 0.1 % nasal spray, Place 1-2 sprays into both nostrils 2 (two) times daily., Disp: 30 mL, Rfl: 12   benzonatate (TESSALON) 200 MG capsule, Take 1 capsule (200 mg total) by mouth 3 (three) times daily as needed for cough., Disp: 30 capsule, Rfl: 0   loratadine-pseudoephedrine (CLARITIN-D 12 HOUR) 5-120 MG tablet, Take 1 tablet by mouth 2 (two) times daily., Disp: 60 tablet, Rfl: 5   predniSONE (DELTASONE) 10 MG tablet, Take by mouth as directed., Disp: , Rfl:   Social History   Tobacco Use  Smoking Status Former   Current packs/day: 0.00   Average packs/day: 0.5 packs/day for 2.0 years (1.0 ttl pk-yrs)   Types: Cigarettes   Start date: 05/13/2001   Quit date: 05/14/2003   Years since quitting: 20.1  Smokeless Tobacco Never    Allergies  Allergen Reactions   Penicillins Hives    Other reaction(s): Unknown   Ciprofloxacin Hcl Other (See Comments)    Other reaction(s): Abdominal Pain He had numbness in his penis.  He felt he had build up of skin like a blister.    Objective:  There were no vitals filed for this visit. There is no height or weight on file to calculate BMI. Constitutional  Well developed. Well nourished.  Vascular Dorsalis pedis pulses palpable bilaterally. Posterior tibial pulses palpable bilaterally. Capillary refill normal to all digits.  No cyanosis or clubbing noted. Pedal hair growth normal.  Neurologic Normal speech. Oriented to person, place, and time. Epicritic sensation to light touch grossly present bilaterally.  Dermatologic Nails well groomed and normal in appearance. No open wounds. No skin lesions.  Orthopedic: Pain on palpation right fifth digit mild ecchymosis noted mild edema noted.  No angulation or deformity noted to the fifth   Radiographs: Comminuted and displaced fifth toe proximal phalanx fracture with metatarsal-phalangeal joint intra-articular extension.   Assessment:   1. Closed fracture of phalanx of right fifth toe, initial encounter    Plan:  Patient was evaluated and treated and all questions answered.  Right fifth digit fracture with intra-articular extension -All questions and concerns were discussed with the patient in extensive detail -I discussed with the patient that surgical indication is not warranted I discussed that in the future he may have arthritis near the joint.  He states understanding. -Discussed continue wearing surgical shoe for next 4 to 6 weeks -Buddy splinting was discussed  No follow-ups on file.
